# Patient Record
Sex: Male | Born: 1965 | Race: White | Hispanic: No | Smoking: Never smoker
Health system: Southern US, Community
[De-identification: ages and names within clinical notes are randomized; demographics above are authoritative.]

## PROBLEM LIST (undated history)

## (undated) DIAGNOSIS — N200 Calculus of kidney: Secondary | ICD-10-CM

## (undated) DIAGNOSIS — I10 Essential (primary) hypertension: Secondary | ICD-10-CM

## (undated) DIAGNOSIS — A4902 Methicillin resistant Staphylococcus aureus infection, unspecified site: Secondary | ICD-10-CM

---

## 2015-08-13 ENCOUNTER — Emergency Department (HOSPITAL_BASED_OUTPATIENT_CLINIC_OR_DEPARTMENT_OTHER)
Admission: EM | Admit: 2015-08-13 | Discharge: 2015-08-13 | Disposition: A | Discharge status: Home / Self Care | Payer: Medicaid Other | Attending: Emergency Medicine | Admitting: Emergency Medicine

## 2015-08-13 ENCOUNTER — Encounter (HOSPITAL_BASED_OUTPATIENT_CLINIC_OR_DEPARTMENT_OTHER): Payer: Self-pay | Admitting: *Deleted

## 2015-08-13 ENCOUNTER — Emergency Department (HOSPITAL_BASED_OUTPATIENT_CLINIC_OR_DEPARTMENT_OTHER): Payer: Medicaid Other

## 2015-08-13 DIAGNOSIS — Z79899 Other long term (current) drug therapy: Secondary | ICD-10-CM | POA: Insufficient documentation

## 2015-08-13 DIAGNOSIS — R1011 Right upper quadrant pain: Secondary | ICD-10-CM | POA: Diagnosis not present

## 2015-08-13 DIAGNOSIS — R197 Diarrhea, unspecified: Secondary | ICD-10-CM | POA: Insufficient documentation

## 2015-08-13 DIAGNOSIS — Z87442 Personal history of urinary calculi: Secondary | ICD-10-CM | POA: Insufficient documentation

## 2015-08-13 DIAGNOSIS — Z791 Long term (current) use of non-steroidal anti-inflammatories (NSAID): Secondary | ICD-10-CM | POA: Diagnosis not present

## 2015-08-13 DIAGNOSIS — I10 Essential (primary) hypertension: Secondary | ICD-10-CM | POA: Diagnosis not present

## 2015-08-13 DIAGNOSIS — R112 Nausea with vomiting, unspecified: Secondary | ICD-10-CM | POA: Diagnosis not present

## 2015-08-13 DIAGNOSIS — Z8614 Personal history of Methicillin resistant Staphylococcus aureus infection: Secondary | ICD-10-CM | POA: Insufficient documentation

## 2015-08-13 HISTORY — DX: Calculus of kidney: N20.0

## 2015-08-13 HISTORY — DX: Essential (primary) hypertension: I10

## 2015-08-13 HISTORY — DX: Methicillin resistant Staphylococcus aureus infection, unspecified site: A49.02

## 2015-08-13 LAB — CBC WITH DIFFERENTIAL/PLATELET
Basophils Absolute: 0 10*3/uL (ref 0.0–0.1)
Basophils Relative: 1 % (ref 0–1)
Eosinophils Absolute: 0.2 10*3/uL (ref 0.0–0.7)
Eosinophils Relative: 3 % (ref 0–5)
HEMATOCRIT: 43.8 % (ref 39.0–52.0)
HEMOGLOBIN: 14.9 g/dL (ref 13.0–17.0)
LYMPHS PCT: 33 % (ref 12–46)
Lymphs Abs: 2.6 10*3/uL (ref 0.7–4.0)
MCH: 29.5 pg (ref 26.0–34.0)
MCHC: 34 g/dL (ref 30.0–36.0)
MCV: 86.7 fL (ref 78.0–100.0)
MONO ABS: 0.7 10*3/uL (ref 0.1–1.0)
MONOS PCT: 9 % (ref 3–12)
Neutro Abs: 4.4 10*3/uL (ref 1.7–7.7)
Neutrophils Relative %: 54 % (ref 43–77)
Platelets: 236 10*3/uL (ref 150–400)
RBC: 5.05 MIL/uL (ref 4.22–5.81)
RDW: 14.9 % (ref 11.5–15.5)
WBC: 7.9 10*3/uL (ref 4.0–10.5)

## 2015-08-13 LAB — URINE MICROSCOPIC-ADD ON

## 2015-08-13 LAB — COMPREHENSIVE METABOLIC PANEL
ALK PHOS: 74 U/L (ref 38–126)
ALT: 30 U/L (ref 17–63)
AST: 22 U/L (ref 15–41)
Albumin: 3.7 g/dL (ref 3.5–5.0)
Anion gap: 9 (ref 5–15)
BILIRUBIN TOTAL: 0.5 mg/dL (ref 0.3–1.2)
BUN: 17 mg/dL (ref 6–20)
CALCIUM: 9.3 mg/dL (ref 8.9–10.3)
CO2: 27 mmol/L (ref 22–32)
Chloride: 101 mmol/L (ref 101–111)
Creatinine, Ser: 0.85 mg/dL (ref 0.61–1.24)
GFR calc Af Amer: >60 mL/min (ref >60)
Glucose, Bld: 147 mg/dL — ABNORMAL HIGH (ref 65–99)
POTASSIUM: 3.7 mmol/L (ref 3.5–5.1)
Sodium: 137 mmol/L (ref 135–145)
TOTAL PROTEIN: 7.4 g/dL (ref 6.5–8.1)

## 2015-08-13 LAB — URINALYSIS, ROUTINE W REFLEX MICROSCOPIC
Bilirubin Urine: NEGATIVE
GLUCOSE, UA: NEGATIVE mg/dL
Hgb urine dipstick: NEGATIVE
Ketones, ur: NEGATIVE mg/dL
LEUKOCYTES UA: NEGATIVE
Nitrite: NEGATIVE
PH: 5 (ref 5.0–8.0)
Protein, ur: 100 mg/dL — AB
SPECIFIC GRAVITY, URINE: 1.019 (ref 1.005–1.030)
Urobilinogen, UA: 0.2 mg/dL (ref 0.0–1.0)

## 2015-08-13 LAB — LIPASE, BLOOD: Lipase: 24 U/L (ref 22–51)

## 2015-08-13 MED ORDER — SODIUM CHLORIDE 0.9 % IV BOLUS (SEPSIS)
1000.0000 mL | Freq: Once | INTRAVENOUS | Status: AC
  Administered 2015-08-13: 1000 mL via INTRAVENOUS

## 2015-08-13 MED ORDER — ONDANSETRON 4 MG PO TBDP
4.0000 mg | ORAL_TABLET | Freq: Three times a day (TID) | ORAL | Status: AC | PRN
Start: 2015-08-13 — End: ?

## 2015-08-13 MED ORDER — ONDANSETRON HCL 4 MG/2ML IJ SOLN
4.0000 mg | Freq: Once | INTRAMUSCULAR | Status: AC
  Administered 2015-08-13: 4 mg via INTRAVENOUS
  Filled 2015-08-13: qty 2

## 2015-08-13 MED ORDER — OXYCODONE-ACETAMINOPHEN 5-325 MG PO TABS
1.0000 | ORAL_TABLET | Freq: Once | ORAL | Status: AC
  Administered 2015-08-13: 1 via ORAL
  Filled 2015-08-13: qty 1

## 2015-08-13 NOTE — ED Provider Notes (Signed)
CSN: [id number]     Arrival date & time 08/13/15  1003 History   First MD Initiated Contact with Patient 08/13/15 1014     No chief complaint on file.    (Consider location/radiation/quality/duration/timing/severity/associated sxs/prior Treatment) HPI Comments: Pt is a 49 yo male with history of kidney stones, HTN and DVT who presents to the ED with complaint of RUQ pain, onset 6 weeks. He reports sharp pain in his RUQ that radiates to his right shoulder and worsens with eating. Pt reports he has been to multiple EDs (4 times) in the past 2 weeks due to this pain. He notes he had a full cardiac workup last Tuesday at [facility] ED (pt reports negative workup  for ACS, cardiac disease or PE) and was told he had pneumonia and was d/c home with azithromycin. He notes 3 days after taking the antibiotic he started having watery diarrhea. Pt also reports history of kidney stones and notes that he had a CT done a few weeks ago and was told he had non-obstructing stones in his right kidney. He reports right flank pain that has improved since onset a few weeks ago. Endorses nausea. Denies fever, headache, SOB, CP, vomiting, bloody stool or emesis, urinary sxs, weakness, lower extremity swelling.    Past Medical History  Diagnosis Date  . Kidney stones   . MRSA (methicillin resistant Staphylococcus aureus)   . Hypertension    History reviewed. No pertinent past surgical history. No family history on file. Social History  Substance Use Topics  . Smoking status: Never Smoker   . Smokeless tobacco: None  . Alcohol Use: None    Review of Systems  Gastrointestinal: Positive for nausea, abdominal pain and diarrhea.  All other systems reviewed and are negative.     Allergies  Losartan  Home Medications   Prior to Admission medications   Medication Sig Start Date End Date Taking? Authorizing Provider  ALPRAZolam Prudy Feeler) 1 MG tablet Take 1 mg by mouth at bedtime as needed for anxiety.    Yes Historical Provider, MD  amLODipine (NORVASC) 10 MG tablet Take 10 mg by mouth daily.   Yes Historical Provider, MD  ARIPiprazole (ABILIFY) 10 MG tablet Take 10 mg by mouth daily.   Yes Historical Provider, MD  citalopram (CELEXA) 10 MG tablet Take 10 mg by mouth daily.   Yes Historical Provider, MD  colchicine 0.6 MG tablet Take 0.6 mg by mouth daily.   Yes Historical Provider, MD  hydrochlorothiazide (MICROZIDE) 12.5 MG capsule Take 12.5 mg by mouth daily.   Yes Historical Provider, MD  HYDROcodone-acetaminophen (NORCO) 10-325 MG per tablet Take 1 tablet by mouth every 6 (six) hours as needed.   Yes Historical Provider, MD  meloxicam (MOBIC) 15 MG tablet Take 15 mg by mouth daily.   Yes Historical Provider, MD  rivaroxaban (XARELTO) 10 MG TABS tablet Take 10 mg by mouth daily.   Yes Historical Provider, MD   BP 172/130 mmHg  Pulse 93  Temp(Src) 98.1 F (36.7 C) (Oral)  Resp 18  Ht 6\' 2"  (1.88 m)  Wt 412 lb 2 oz (186.939 kg)  BMI 52.89 kg/m2  SpO2 96% Physical Exam  Constitutional: He is oriented to person, place, and time. He appears well-developed and well-nourished.  Morbidly obese.  HENT:  Head: Normocephalic and atraumatic.  Mouth/Throat: Oropharynx is clear and moist.  Eyes: Conjunctivae and EOM are normal. Pupils are equal, round, and reactive to light. Right eye exhibits no discharge. Left eye  exhibits no discharge. No scleral icterus.  Neck: Normal range of motion. Neck supple.  Cardiovascular: Normal rate, regular rhythm, normal heart sounds and intact distal pulses.   Pulmonary/Chest: Effort normal and breath sounds normal. He has no wheezes. He has no rales. He exhibits no tenderness.  Abdominal: Soft. Bowel sounds are normal. He exhibits no distension and no mass. There is tenderness in the right upper quadrant. There is positive Murphy's sign. There is no rebound, no guarding and no CVA tenderness.  Musculoskeletal: Normal range of motion. He exhibits no edema or  tenderness.  Lymphadenopathy:    He has no cervical adenopathy.  Neurological: He is alert and oriented to person, place, and time.  Skin: Skin is warm and dry.  Nursing note and vitals reviewed.   ED Course  Procedures (including critical care time) Labs Review Labs Reviewed  COMPREHENSIVE METABOLIC PANEL  CBC WITH DIFFERENTIAL/PLATELET  LIPASE, BLOOD  URINALYSIS, ROUTINE W REFLEX MICROSCOPIC (NOT AT [facility])    Imaging Review No results found. I have personally reviewed and evaluated these images and lab results as part of my medical decision-making.  Filed Vitals:   08/13/15 1405  BP: 140/87  Pulse: 72  Temp:   Resp:    Meds given in ED:  Medications  sodium chloride 0.9 % bolus 1,000 mL (0 mLs Intravenous Stopped 08/13/15 1155)  ondansetron (ZOFRAN) injection 4 mg (4 mg Intravenous Given 08/13/15 1103)  oxyCODONE-acetaminophen (PERCOCET/ROXICET) 5-325 MG per tablet 1 tablet (1 tablet Oral Given 08/13/15 1106)    Discharge Medication List as of 08/13/2015  2:01 PM    START taking these medications   Details  ondansetron (ZOFRAN ODT) 4 MG disintegrating tablet Take 1 tablet (4 mg total) by mouth every 8 (eight) hours as needed for nausea or vomiting., Starting 08/13/2015, Until Discontinued, Print         MDM   Final diagnoses:  Right upper quadrant pain  Nausea vomiting and diarrhea    Pt presents with RUQ abdominal pain, nausea and diarrhea. VSS. Exam revealed RUQ tenderness and positive murphy's sign. Ordered US to evaluate gallbladder. C. Diff PCR ordered. CBC, CMP, Lipase, US unremarkable. US showed no gallstones, wall thickening or pericholecystic fluid. Pt given zofran and pain meds. Pt has not been had BM in ED. I do not suspect c. Diff at this time based off the pt's presentation and pt not having an episode of diarrhea during 4 hours while in ED. Pt reports pain and nausea have improved. Plan to d/c pt home with zofran and given contact info to follow up with GI  this week.   Evaluation does not show pathology requring ongoing emergent intervention or admission. Pt is hemodynamically stable and mentating appropriately. Discussed findings/results and plan with patient/guardian, who agrees with plan. All questions answered. Return precautions discussed and outpatient follow up given.      Satira Sark [place], [place] 08/13/15 1709  Geoffery Lyons, MD 08/14/15 2131

## 2015-08-13 NOTE — ED Notes (Addendum)
C/o kidney stones on right side x 2 months. Pt states he has been to ED x 4 in 2 weeks for same. C/o n/v. States his psychiatrist thinks it is gallbladder. No problems urinating. C/o diarrhea since last Wednesday since being given antibiotic.

## 2015-08-13 NOTE — Discharge Instructions (Signed)
Please follow up with GI in the next week. Please follow up with your primary care provider in the next 2-3 days. Please take Zofran as prescribed for pain and tylenol for pain relief.  Please return to the Emergency Department if symptoms worsen.

## 2016-09-07 DEATH — deceased

## 2017-07-23 IMAGING — US US ABDOMEN COMPLETE
1 series · 14 of 25 positions shown · non-contrast
Comparison: Abdomen and pelvis CT dated 08/01/2015.

CLINICAL DATA: Right flank pain radiating to the right upper
quadrant of the abdomen. Nausea, vomiting and diarrhea for the past
6 weeks.

EXAM:
ULTRASOUND ABDOMEN COMPLETE

[Series 1: us abdomen complete · 0.28mm/px · 14 of 64 slices shown]
[im 1/64]
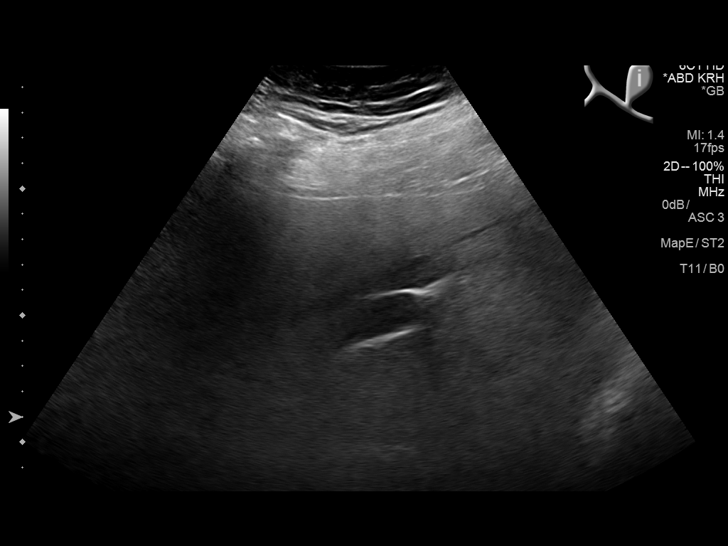
[im 6/64]
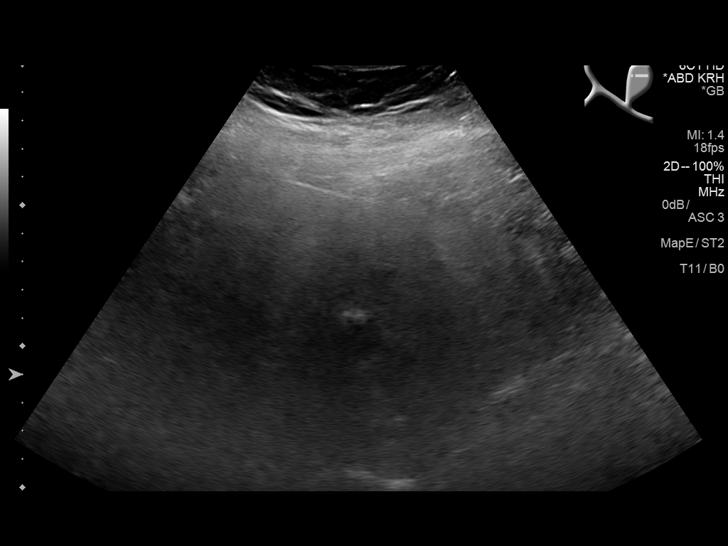
[im 11/64]
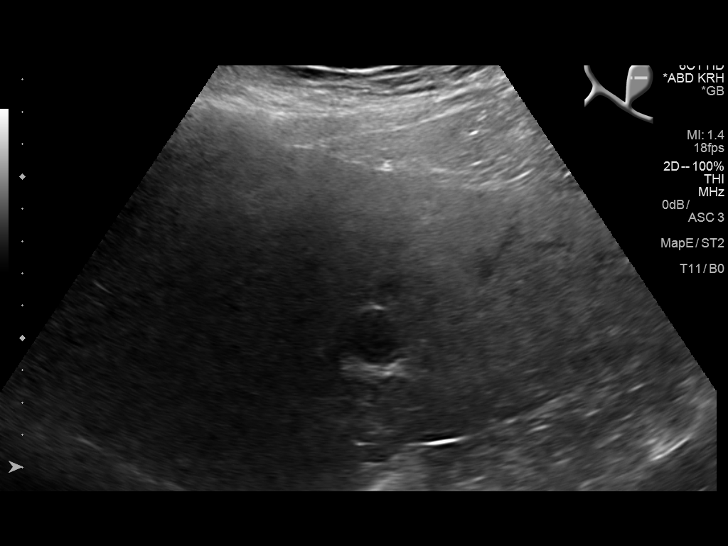
[im 16/64]
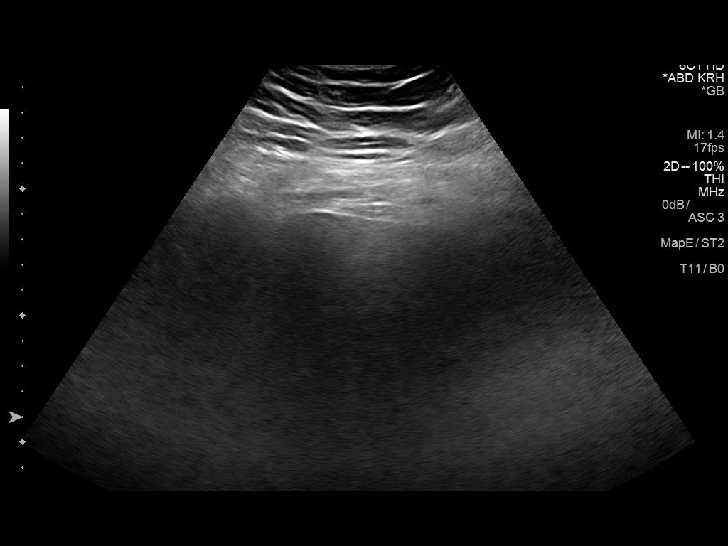
[im 22/64]
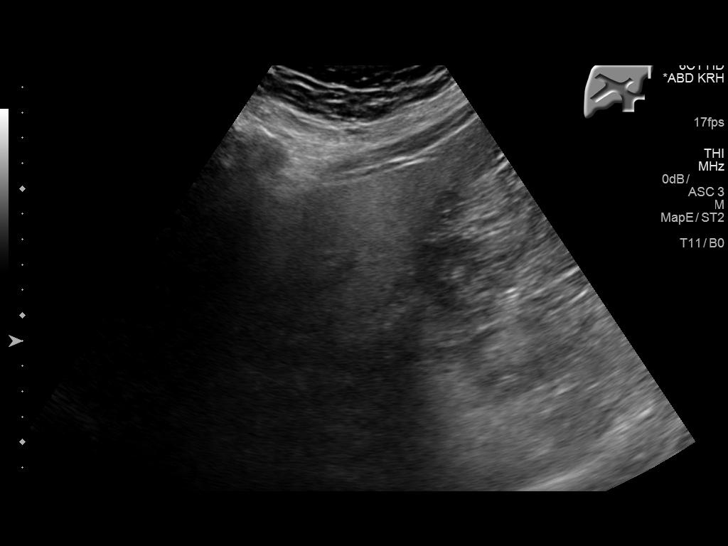
[im 24/64]
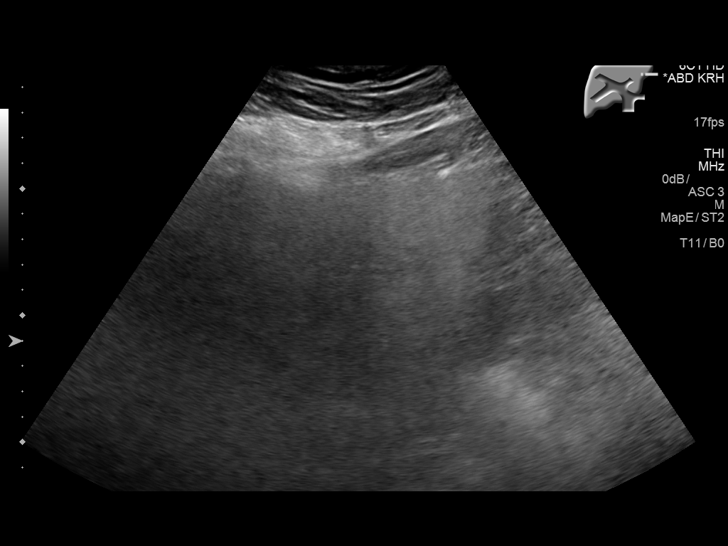
[im 29/64]
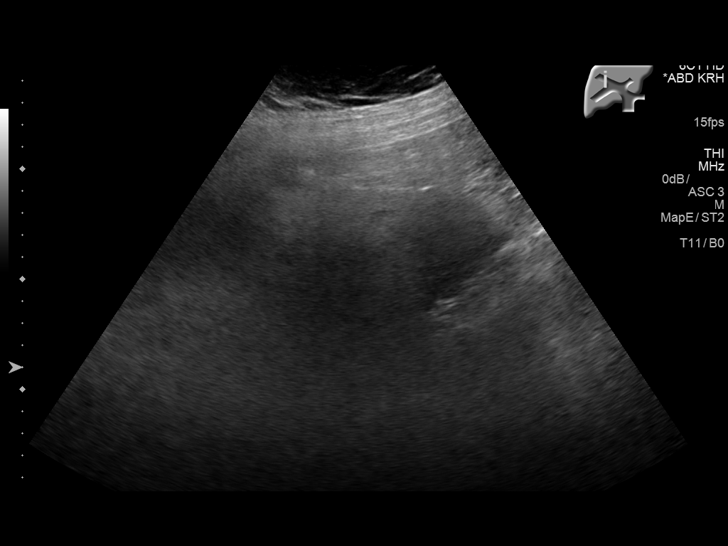
[im 35/64]
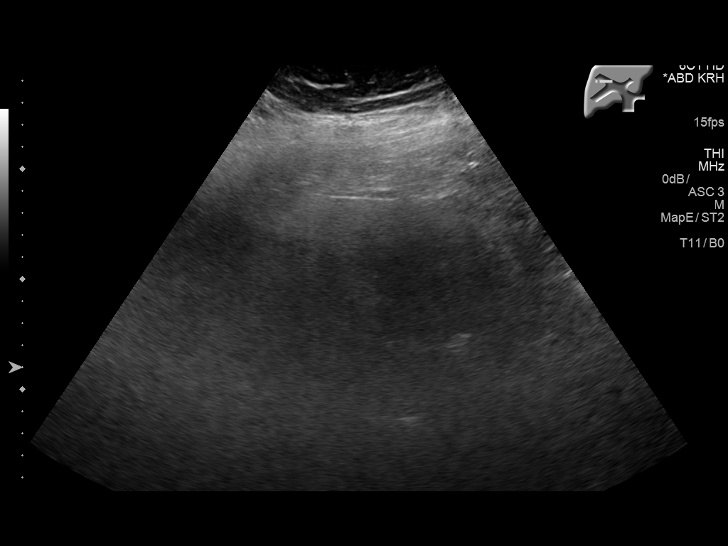
[im 40/64]
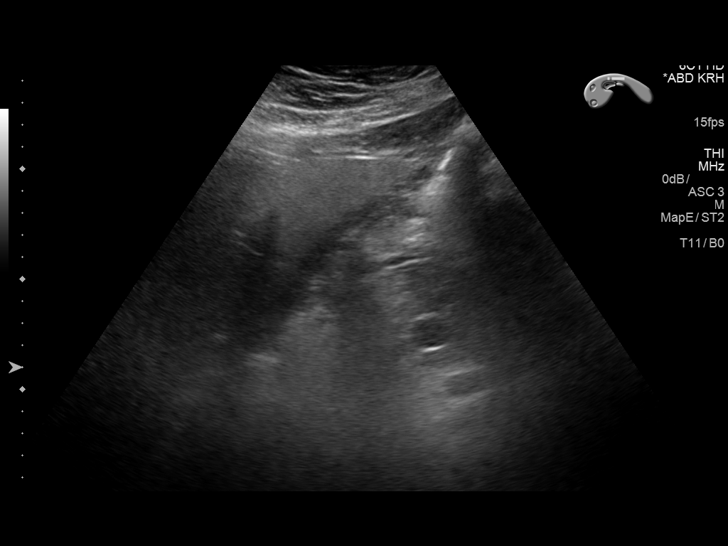
[im 43/64]
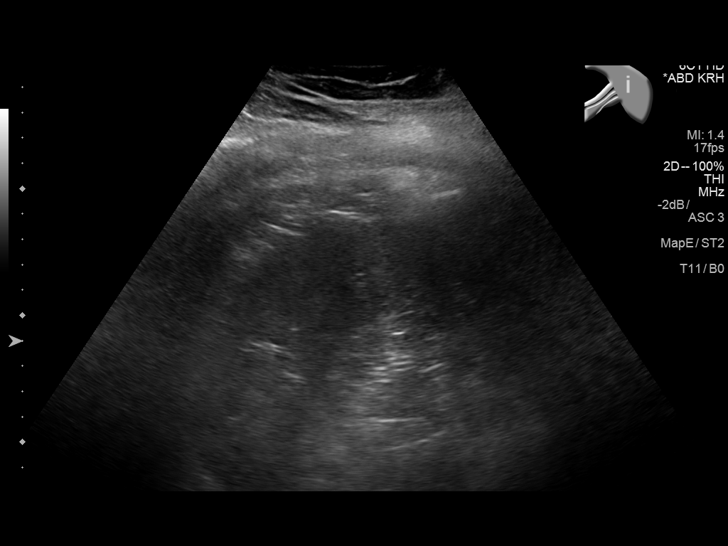
[im 48/64]
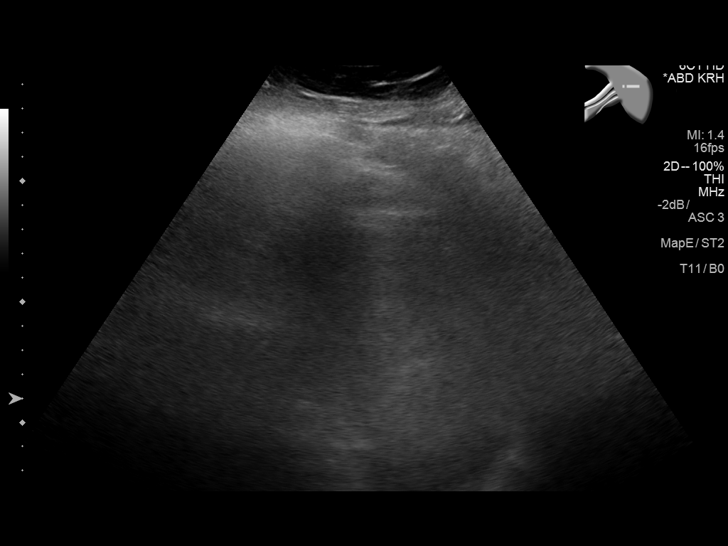
[im 53/64]
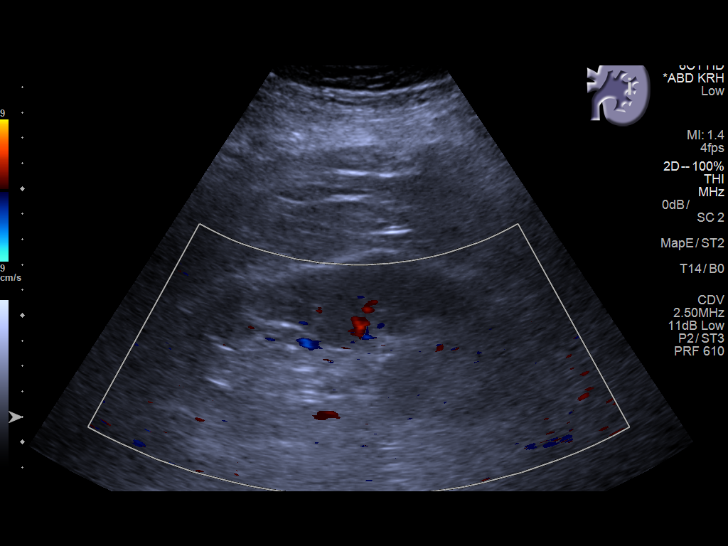
[im 58/64]
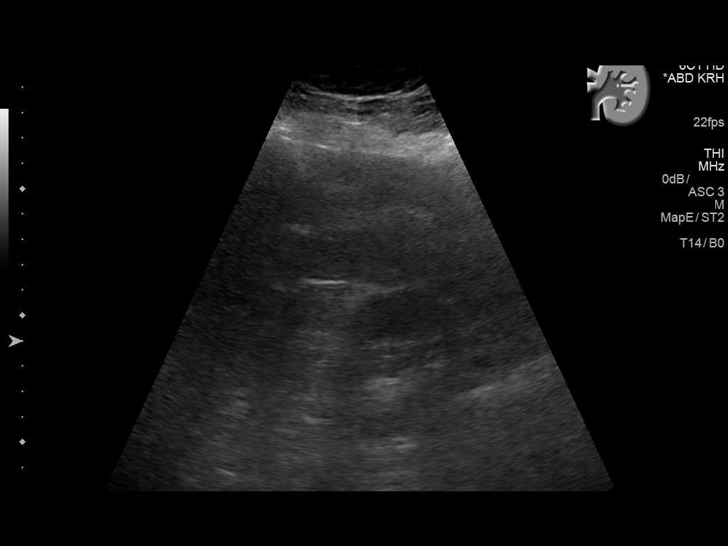
[im 64/64]
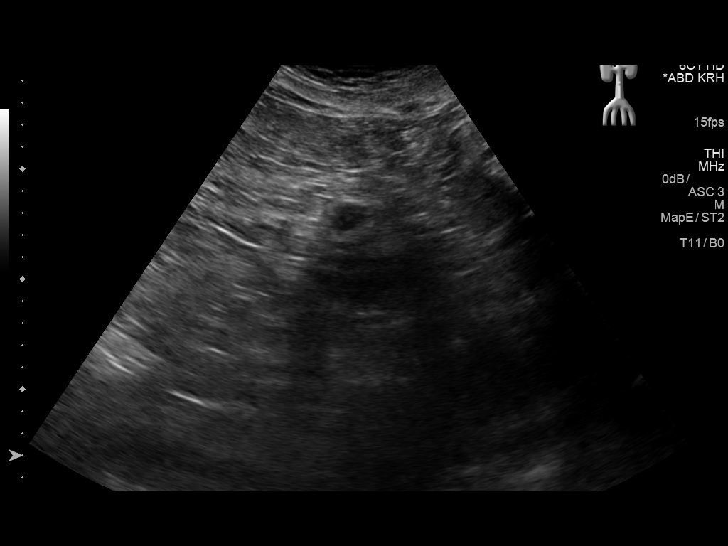

[14 of 25 positions shown; findings below may reference images not displayed]

FINDINGS: Gallbladder: Poorly visualized. No visible gallstones, wall
thickening or pericholecystic fluid. The patient was not tender over
the gallbladder.

Common bile duct: Diameter: Not visualized

Liver: Poorly visualized. Mildly echogenic with mild diffuse low
density of the liver relative to the spleen on the recent CT.

IVC: Not well visualized.

Pancreas: Poorly visualized

Spleen: Size and appearance within normal limits.

Right Kidney:  Not visualized

Left Kidney: Length: 14.1 cm. Normal echogenicity. Stable mildly
dilated collecting system without ureteral dilatation.

Abdominal aorta: Normal in caliber proximally, not visualized
distally.

Other findings: None.
IMPRESSION: 1. Very limited examination due to patient body habitus and bowel
gas.
2. Nonvisualized right kidney, majority of the pancreas and majority
of the abdominal aorta.
3. Mild diffuse hepatic steatosis.
